# Patient Record
Sex: Male | Born: 2000 | Race: Black or African American | Hispanic: No | Marital: Single | Smoking: Never smoker
Health system: Southern US, Community
[De-identification: ages and names within clinical notes are randomized; demographics above are authoritative.]

## PROBLEM LIST (undated history)

## (undated) HISTORY — PX: WISDOM TOOTH EXTRACTION: SHX21

---
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
## 2020-10-19 ENCOUNTER — Ambulatory Visit (HOSPITAL_COMMUNITY)
Admission: EM | Admit: 2020-10-19 | Discharge: 2020-10-19 | Disposition: A | Discharge status: Home / Self Care | Payer: Self-pay | Attending: Emergency Medicine | Admitting: Emergency Medicine

## 2020-10-19 ENCOUNTER — Encounter (HOSPITAL_COMMUNITY): Payer: Self-pay

## 2020-10-19 DIAGNOSIS — Z113 Encounter for screening for infections with a predominantly sexual mode of transmission: Secondary | ICD-10-CM | POA: Insufficient documentation

## 2020-10-19 NOTE — Discharge Instructions (Addendum)
  Your STD tests are pending.  If your test results are positive, we will call you.  You and your sexual partner(s) may require treatment at that time.  Do not have sexual activity until the test results are back.  

## 2020-10-19 NOTE — ED Triage Notes (Signed)
Pt presents for STD's testing. Denies dysuria, abdominal pain, penile discharge.

## 2020-10-19 NOTE — ED Provider Notes (Signed)
[facility]    CSN: [id number] Arrival date & time: 10/19/20  1745      History   Chief Complaint Chief Complaint  Patient presents with  . STD testing    HPI Leon Russell is a 19 y.o. male.   Patient presents with request for STD testing.  He is sexually active and does not consistently use condoms.  He denies symptoms, including penile discharge, dysuria, abdominal pain, back pain, testicular pain, rash, lesions.  He denies pertinent medical history.  The history is provided by the patient.    History reviewed. No pertinent past medical history.  There are no problems to display for this patient.   History reviewed. No pertinent surgical history.     Home Medications    Prior to Admission medications   Not on File    Family History History reviewed. No pertinent family history.  Social History Social History   Tobacco Use  . Smoking status: Never Smoker  . Smokeless tobacco: Never Used  Substance Use Topics  . Alcohol use: Yes  . Drug use: Yes    Frequency: 7.0 times per week    Types: Marijuana     Allergies   Patient has no known allergies.   Review of Systems Review of Systems  Constitutional: Negative for chills and fever.  HENT: Negative for ear pain and sore throat.   Eyes: Negative for pain and visual disturbance.  Respiratory: Negative for cough and shortness of breath.   Cardiovascular: Negative for chest pain and palpitations.  Gastrointestinal: Negative for abdominal pain and vomiting.  Genitourinary: Negative for discharge, dysuria, flank pain, hematuria and testicular pain.  Musculoskeletal: Negative for arthralgias and back pain.  Skin: Negative for color change and rash.  Neurological: Negative for seizures and syncope.  All other systems reviewed and are negative.    Physical Exam Triage Vital Signs ED Triage Vitals  Enc Vitals Group     BP 10/19/20 1823 134/74     Pulse Rate 10/19/20 1823 69     Resp  10/19/20 1823 15     Temp 10/19/20 1823 98.1 F (36.7 C)     Temp Source 10/19/20 1823 Oral     SpO2 10/19/20 1823 100 %     Weight --      Height --      Head Circumference --      Peak Flow --      Pain Score 10/19/20 1824 0     Pain Loc --      Pain Edu? --      Excl. in GC? --    No data found.  Updated Vital Signs BP 134/74 (BP Location: Right Arm)   Pulse 69   Temp 98.1 F (36.7 C) (Oral)   Resp 15   SpO2 100%   Visual Acuity Right Eye Distance:   Left Eye Distance:   Bilateral Distance:    Right Eye Near:   Left Eye Near:    Bilateral Near:     Physical Exam Vitals and nursing note reviewed.  Constitutional:      General: He is not in acute distress.    Appearance: He is well-developed. He is not ill-appearing.  HENT:     Head: Normocephalic and atraumatic.     Mouth/Throat:     Mouth: Mucous membranes are moist.  Eyes:     Conjunctiva/sclera: Conjunctivae normal.  Cardiovascular:     Rate and Rhythm: Normal rate and regular rhythm.  Heart sounds: Normal heart sounds.  Pulmonary:     Effort: Pulmonary effort is normal. No respiratory distress.     Breath sounds: Normal breath sounds.  Abdominal:     Palpations: Abdomen is soft.     Tenderness: There is no abdominal tenderness. There is no right CVA tenderness, left CVA tenderness, guarding or rebound.  Musculoskeletal:     Cervical back: Neck supple.  Skin:    General: Skin is warm and dry.     Findings: No rash.  Neurological:     General: No focal deficit present.     Mental Status: He is alert and oriented to person, place, and time.     Gait: Gait normal.  Psychiatric:        Mood and Affect: Mood normal.        Behavior: Behavior normal.      UC Treatments / Results  Labs (all labs ordered are listed, but only abnormal results are displayed) Labs Reviewed  CYTOLOGY, (ORAL, ANAL, URETHRAL) ANCILLARY ONLY    EKG   Radiology No results found.  Procedures Procedures  (including critical care time)  Medications Ordered in UC Medications - No data to display  Initial Impression / Assessment and Plan / UC Course  I have reviewed the triage vital signs and the nursing notes.  Pertinent labs & imaging results that were available during my care of the patient were reviewed by me and considered in my medical decision making (see chart for details).   Screening for STDs.  Patient obtained urethral self swab for testing.  Instructed him to abstain from sexual activity until the test results are back.  Discussed that he and his sexual partners may require treatment at that time.  Patient agrees to plan of care.   Final Clinical Impressions(s) / UC Diagnoses   Final diagnoses:  Screen for STD (sexually transmitted disease)     Discharge Instructions      Your STD tests are pending.  If your test results are positive, we will call you.  You and your sexual partner(s) may require treatment at that time.  Do not have sexual activity until the test results are back.     ED Prescriptions    None     PDMP not reviewed this encounter.   Mickie Bail, NP 10/19/20 [phone]

## 2020-10-21 LAB — CYTOLOGY, (ORAL, ANAL, URETHRAL) ANCILLARY ONLY
Chlamydia: POSITIVE — AB
Comment: NEGATIVE
Comment: NEGATIVE
Comment: NORMAL
Neisseria Gonorrhea: NEGATIVE
Trichomonas: NEGATIVE

## 2020-10-22 ENCOUNTER — Telehealth (HOSPITAL_COMMUNITY): Payer: Self-pay | Admitting: Emergency Medicine

## 2020-10-22 MED ORDER — DOXYCYCLINE HYCLATE 100 MG PO CAPS: 100.0000 mg | ORAL_CAPSULE | Freq: Two times a day (BID) | ORAL | 0 refills | Status: AC

## 2022-07-16 ENCOUNTER — Ambulatory Visit (HOSPITAL_COMMUNITY): Payer: Self-pay

## 2022-08-09 ENCOUNTER — Emergency Department (HOSPITAL_COMMUNITY)
Admission: EM | Admit: 2022-08-09 | Discharge: 2022-08-09 | Discharge status: Against Medical Advice | Payer: BC Managed Care – PPO | Attending: Emergency Medicine | Admitting: Emergency Medicine

## 2022-08-09 ENCOUNTER — Encounter (HOSPITAL_COMMUNITY): Payer: Self-pay

## 2022-08-09 ENCOUNTER — Other Ambulatory Visit: Payer: Self-pay

## 2022-08-09 DIAGNOSIS — R799 Abnormal finding of blood chemistry, unspecified: Secondary | ICD-10-CM | POA: Insufficient documentation

## 2022-08-09 DIAGNOSIS — X58XXXA Exposure to other specified factors, initial encounter: Secondary | ICD-10-CM | POA: Diagnosis not present

## 2022-08-09 DIAGNOSIS — T40604A Poisoning by unspecified narcotics, undetermined, initial encounter: Secondary | ICD-10-CM

## 2022-08-09 DIAGNOSIS — Y902 Blood alcohol level of 40-59 mg/100 ml: Secondary | ICD-10-CM | POA: Diagnosis not present

## 2022-08-09 DIAGNOSIS — T402X4A Poisoning by other opioids, undetermined, initial encounter: Secondary | ICD-10-CM | POA: Diagnosis not present

## 2022-08-09 DIAGNOSIS — T50904A Poisoning by unspecified drugs, medicaments and biological substances, undetermined, initial encounter: Secondary | ICD-10-CM | POA: Diagnosis present

## 2022-08-09 LAB — CBC WITH DIFFERENTIAL/PLATELET
Abs Immature Granulocytes: 0.02 10*3/uL (ref 0.00–0.07)
Basophils Absolute: 0 10*3/uL (ref 0.0–0.1)
Basophils Relative: 0 %
Eosinophils Absolute: 0.1 10*3/uL (ref 0.0–0.5)
Eosinophils Relative: 1 %
HCT: 42.2 % (ref 39.0–52.0)
Hemoglobin: 13.8 g/dL (ref 13.0–17.0)
Immature Granulocytes: 0 %
Lymphocytes Relative: 15 %
Lymphs Abs: 1.4 10*3/uL (ref 0.7–4.0)
MCH: 31 pg (ref 26.0–34.0)
MCHC: 32.7 g/dL (ref 30.0–36.0)
MCV: 94.8 fL (ref 80.0–100.0)
Monocytes Absolute: 0.7 10*3/uL (ref 0.1–1.0)
Monocytes Relative: 7 %
Neutro Abs: 7.2 10*3/uL (ref 1.7–7.7)
Neutrophils Relative %: 77 %
Platelets: 317 10*3/uL (ref 150–400)
RBC: 4.45 MIL/uL (ref 4.22–5.81)
RDW: 12.6 % (ref 11.5–15.5)
WBC: 9.4 10*3/uL (ref 4.0–10.5)
nRBC: 0 % (ref 0.0–0.2)

## 2022-08-09 LAB — ACETAMINOPHEN LEVEL: Acetaminophen (Tylenol), Serum: <10 ug/mL — ABNORMAL LOW (ref 10–30)

## 2022-08-09 LAB — COMPREHENSIVE METABOLIC PANEL
ALT: 29 U/L (ref 0–44)
AST: 47 U/L — ABNORMAL HIGH (ref 15–41)
Albumin: 4.5 g/dL (ref 3.5–5.0)
Alkaline Phosphatase: 40 U/L (ref 38–126)
Anion gap: 22 — ABNORMAL HIGH (ref 5–15)
BUN: 10 mg/dL (ref 6–20)
CO2: 25 mmol/L (ref 22–32)
Calcium: 9.5 mg/dL (ref 8.9–10.3)
Chloride: 94 mmol/L — ABNORMAL LOW (ref 98–111)
Creatinine, Ser: 1.17 mg/dL (ref 0.61–1.24)
GFR, Estimated: >60 mL/min (ref >60)
Glucose, Bld: 208 mg/dL — ABNORMAL HIGH (ref 70–99)
Potassium: 4.9 mmol/L (ref 3.5–5.1)
Sodium: 141 mmol/L (ref 135–145)
Total Bilirubin: 0.5 mg/dL (ref 0.3–1.2)
Total Protein: 8.3 g/dL — ABNORMAL HIGH (ref 6.5–8.1)

## 2022-08-09 LAB — ETHANOL: Alcohol, Ethyl (B): 56 mg/dL — ABNORMAL HIGH (ref <10)

## 2022-08-09 LAB — SALICYLATE LEVEL: Salicylate Lvl: <7 mg/dL — ABNORMAL LOW (ref 7.0–30.0)

## 2022-08-09 MED ORDER — NALOXONE HCL 0.4 MG/ML IJ SOLN
INTRAMUSCULAR | Status: AC
  Filled 2022-08-09: qty 1

## 2022-08-09 MED ORDER — NALOXONE HCL 0.4 MG/ML IJ SOLN
0.4000 mg | Freq: Once | INTRAMUSCULAR | Status: AC
  Administered 2022-08-09: 0.4 mg via INTRAVENOUS
  Filled 2022-08-09: qty 1

## 2022-08-09 MED ORDER — ONDANSETRON HCL 4 MG/2ML IJ SOLN: 4.0000 mg | Freq: Once | INTRAMUSCULAR | Status: AC

## 2022-08-09 MED ORDER — ONDANSETRON HCL 4 MG/2ML IJ SOLN
INTRAMUSCULAR | Status: AC
  Administered 2022-08-09: 4 mg via INTRAVENOUS
  Filled 2022-08-09: qty 2

## 2022-08-09 NOTE — ED Triage Notes (Signed)
OD on xanax, percocet. 4mg  narcan given. Taken nasally

## 2022-08-09 NOTE — ED Provider Notes (Signed)
[facility] EMERGENCY DEPARTMENT Provider Note   CSN: [id number] Arrival date & time: 08/09/22  1413     History   TERIQUE SKENE is a 21 y.o. male with no significant PMH presents with OD.   EMS called out for an OD. Patient reportedly snorted 2 percocet and 2 bars of xanax.  Patient was apneic on EMS arrival, EMS bagged patient and gave total of 4 mg intranasal Narcan, to which patient woke up and began breathing on his own. Further history limited by patient's AMS. EMS POC glucose 200s.   HPI     Home Medications Prior to Admission medications   Not on File      Allergies    Patient has no known allergies.    Review of Systems   Review of Systems  Unable to perform ROS: Mental status change    Physical Exam Updated Vital Signs BP 132/73   Pulse 75   Temp 98.1 F (36.7 C) (Axillary)   Resp 13   SpO2 100%  Physical Exam General: Intoxicated-appearing male, lying in bed.  HEENT: PERRLA 48mm, NCAT, No midline C-spine tenderness/ stepoffs, Sclera anicteric, MMM, trachea midline. Cardiology: RRR, no murmurs/rubs/gallops. BL radial and DP pulses equal bilaterally.  Resp: Normal respiratory rate and effort. CTAB, no wheezes, rhonchi, crackles.  Abd: Soft, non-tender, non-distended. No rebound tenderness or guarding.  GU: Deferred. MSK: No peripheral edema or signs of trauma. Extremities without deformity or TTP. No cyanosis or clubbing. Skin: warm, dry. No rashes or lesions. Neuro: Following commands, GCS 9 (E3V1M5), CNs II-XII grossly intact. MAEs. Sensation grossly intact.  Psych: [place]  ED Results / Procedures / Treatments   Labs (all labs ordered are listed, but only abnormal results are displayed) Labs Reviewed  COMPREHENSIVE METABOLIC PANEL - Abnormal; Notable for the following components:      Result Value   Chloride 94 (*)    Glucose, Bld 208 (*)    Total Protein 8.3 (*)    AST 47 (*)    Anion gap 22 (*)    All other components within  normal limits  SALICYLATE LEVEL - Abnormal; Notable for the following components:   Salicylate Lvl [id number] (*)    All other components within normal limits  ACETAMINOPHEN LEVEL - Abnormal; Notable for the following components:   Acetaminophen (Tylenol), Serum <10 (*)    All other components within normal limits  ETHANOL - Abnormal; Notable for the following components:   Alcohol, Ethyl (B) 56 (*)    All other components within normal limits  CBC WITH DIFFERENTIAL/PLATELET  RAPID URINE DRUG SCREEN, HOSP PERFORMED  URINALYSIS, ROUTINE W REFLEX MICROSCOPIC  CBG MONITORING, ED    EKG EKG Interpretation  Date/Time:  Sunday August 09 2022 14:27:30 EDT Ventricular Rate:  103 PR Interval:  172 QRS Duration: 98 QT Interval:  335 QTC Calculation: 439 R Axis:   103 Text Interpretation: Sinus tachycardia Biatrial enlargement Left ventricular hypertrophy Artifact in lead(s) I II III aVR aVL V1 V2 V4 V6 Confirmed by Cindee Lame [phone]) on 08/09/2022 9:19:48 PM  Radiology No results found.  Procedures Procedures    Medications Ordered in ED Medications  ondansetron (ZOFRAN) injection 4 mg (4 mg Intravenous Given 08/09/22 1422)  naloxone [facility]) injection 0.4 mg (0.4 mg Intravenous Given 08/09/22 1509)    ED Course/ Medical Decision Making/ A&P  Medical Decision Making Amount and/or Complexity of Data Reviewed Labs: ordered. Decision-making details documented in ED Course.  Risk Prescription drug management.   Clinical history significant for opiate OD. Patient now breathing on his own with stable vitals.  Pupils equal reactive 5 mm, no focal neurodeficits apparent on exam, no evidence of head trauma to indicate [place].  Will test for concomitant toxic ingestion such as EtOH.  Patient no longer hypoxic on SPO2, POC glucose by EMS was in the 200s and will recheck here for any evidence of hypoglycemia.  EKG w/ sinus tachycardia, significant baseline artifact,  but patient denies CP and will repeat EKG.   I have personally reviewed and interpreted all labs and imaging, which are listed below.  Clinical Course as of 08/09/22 2119  Nancy Fetter Aug 09, 2022  1556 Alcohol, Ethyl (B)(!): 56 [HN]  1556 CBC WITH DIFFERENTIAL wnl [HN]  1556 Comprehensive metabolic panel(!) Slightly elevated glucose and AST, otherwise unremarkable. [HN]  1941 Patient reevaluated. Very drowsy, now responsive to voice and can repeat his name but does not know where he is, cannot give any history. Will CTM. [HN]  [id number] Patient no longer in room [HN]    Clinical Course User Index [HN] Audley Hose, MD   Patient got up and walked out of room without notifying this MD or bedside RN. Patient left prior to workup being complete. Was observing patient for return to his mental status baseline. Was unable to give discharge instructions or return precautions.   Dispo: left prior to workup being complete         Final Clinical Impression(s) / ED Diagnoses Final diagnoses:  Opiate overdose, undetermined intent, initial encounter [facility])    Rx / DC Orders ED Discharge Orders     None        This note was created using dictation software, which may contain spelling or grammatical errors.    Audley Hose, MD 08/09/22 2121

## 2022-08-14 ENCOUNTER — Other Ambulatory Visit (HOSPITAL_COMMUNITY)
Admission: EM | Admit: 2022-08-14 | Discharge: 2022-08-16 | Disposition: A | Discharge status: Home / Self Care | Payer: No Payment, Other | Attending: Psychiatry | Admitting: Psychiatry

## 2022-08-14 DIAGNOSIS — Z1152 Encounter for screening for COVID-19: Secondary | ICD-10-CM | POA: Insufficient documentation

## 2022-08-14 DIAGNOSIS — F1319 Sedative, hypnotic or anxiolytic abuse with unspecified sedative, hypnotic or anxiolytic-induced disorder: Secondary | ICD-10-CM | POA: Insufficient documentation

## 2022-08-14 DIAGNOSIS — Z79899 Other long term (current) drug therapy: Secondary | ICD-10-CM | POA: Diagnosis not present

## 2022-08-14 DIAGNOSIS — F32A Depression, unspecified: Secondary | ICD-10-CM | POA: Diagnosis not present

## 2022-08-14 DIAGNOSIS — Z9151 Personal history of suicidal behavior: Secondary | ICD-10-CM | POA: Insufficient documentation

## 2022-08-14 DIAGNOSIS — Z114 Encounter for screening for human immunodeficiency virus [HIV]: Secondary | ICD-10-CM | POA: Insufficient documentation

## 2022-08-14 DIAGNOSIS — F1994 Other psychoactive substance use, unspecified with psychoactive substance-induced mood disorder: Secondary | ICD-10-CM | POA: Diagnosis present

## 2022-08-14 DIAGNOSIS — F1310 Sedative, hypnotic or anxiolytic abuse, uncomplicated: Secondary | ICD-10-CM

## 2022-08-14 LAB — CBC WITH DIFFERENTIAL/PLATELET
Abs Immature Granulocytes: 0.01 10*3/uL (ref 0.00–0.07)
Basophils Absolute: 0.1 10*3/uL (ref 0.0–0.1)
Basophils Relative: 1 %
Eosinophils Absolute: 0.3 10*3/uL (ref 0.0–0.5)
Eosinophils Relative: 5 %
HCT: 43.3 % (ref 39.0–52.0)
Hemoglobin: 14.6 g/dL (ref 13.0–17.0)
Immature Granulocytes: 0 %
Lymphocytes Relative: 42 %
Lymphs Abs: 2.1 10*3/uL (ref 0.7–4.0)
MCH: 31.3 pg (ref 26.0–34.0)
MCHC: 33.7 g/dL (ref 30.0–36.0)
MCV: 92.9 fL (ref 80.0–100.0)
Monocytes Absolute: 0.3 10*3/uL (ref 0.1–1.0)
Monocytes Relative: 7 %
Neutro Abs: 2.3 10*3/uL (ref 1.7–7.7)
Neutrophils Relative %: 45 %
Platelets: 341 10*3/uL (ref 150–400)
RBC: 4.66 MIL/uL (ref 4.22–5.81)
RDW: 12.3 % (ref 11.5–15.5)
WBC: 5 10*3/uL (ref 4.0–10.5)
nRBC: 0 % (ref 0.0–0.2)

## 2022-08-14 LAB — POCT URINE DRUG SCREEN - MANUAL ENTRY (I-SCREEN)
POC Amphetamine UR: NOT DETECTED
POC Buprenorphine (BUP): NOT DETECTED
POC Cocaine UR: POSITIVE — AB
POC Marijuana UR: POSITIVE — AB
POC Methadone UR: NOT DETECTED
POC Methamphetamine UR: NOT DETECTED
POC Morphine: NOT DETECTED
POC Oxazepam (BZO): POSITIVE — AB
POC Oxycodone UR: NOT DETECTED
POC Secobarbital (BAR): NOT DETECTED

## 2022-08-14 LAB — COMPREHENSIVE METABOLIC PANEL
ALT: 21 U/L (ref 0–44)
AST: 24 U/L (ref 15–41)
Albumin: 4.1 g/dL (ref 3.5–5.0)
Alkaline Phosphatase: 69 U/L (ref 38–126)
Anion gap: 7 (ref 5–15)
BUN: 13 mg/dL (ref 6–20)
CO2: 29 mmol/L (ref 22–32)
Calcium: 9.5 mg/dL (ref 8.9–10.3)
Chloride: 106 mmol/L (ref 98–111)
Creatinine, Ser: 1.14 mg/dL (ref 0.61–1.24)
GFR, Estimated: >60 mL/min (ref >60)
Glucose, Bld: 80 mg/dL (ref 70–99)
Potassium: 3.4 mmol/L — ABNORMAL LOW (ref 3.5–5.1)
Sodium: 142 mmol/L (ref 135–145)
Total Bilirubin: 0.4 mg/dL (ref 0.3–1.2)
Total Protein: 7.9 g/dL (ref 6.5–8.1)

## 2022-08-14 LAB — ETHANOL: Alcohol, Ethyl (B): <10 mg/dL (ref <10)

## 2022-08-14 LAB — HEMOGLOBIN A1C
Hgb A1c MFr Bld: 5.3 % (ref 4.8–5.6)
Mean Plasma Glucose: 105.41 mg/dL

## 2022-08-14 LAB — LIPID PANEL
Cholesterol: 153 mg/dL (ref 0–200)
HDL: 52 mg/dL (ref >40)
LDL Cholesterol: 67 mg/dL (ref 0–99)
Total CHOL/HDL Ratio: 2.9 RATIO
Triglycerides: 171 mg/dL — ABNORMAL HIGH (ref <150)
VLDL: 34 mg/dL (ref 0–40)

## 2022-08-14 LAB — POC SARS CORONAVIRUS 2 AG: SARSCOV2ONAVIRUS 2 AG: NEGATIVE

## 2022-08-14 LAB — RESP PANEL BY RT-PCR (FLU A&B, COVID) ARPGX2
Influenza A by PCR: NEGATIVE
Influenza B by PCR: NEGATIVE
SARS Coronavirus 2 by RT PCR: NEGATIVE

## 2022-08-14 LAB — HIV ANTIBODY (ROUTINE TESTING W REFLEX): HIV Screen 4th Generation wRfx: NONREACTIVE

## 2022-08-14 LAB — TSH: TSH: 2.296 u[IU]/mL (ref 0.350–4.500)

## 2022-08-14 MED ORDER — TRAZODONE HCL 50 MG PO TABS
50.0000 mg | ORAL_TABLET | Freq: Every evening | ORAL | Status: DC | PRN
  Administered 2022-08-15: 50 mg via ORAL
  Filled 2022-08-14 (×2): qty 1

## 2022-08-14 MED ORDER — LORAZEPAM 1 MG PO TABS: 1.0000 mg | ORAL_TABLET | Freq: Four times a day (QID) | ORAL | Status: DC | PRN

## 2022-08-14 MED ORDER — ONDANSETRON 4 MG PO TBDP: 4.0000 mg | ORAL_TABLET | Freq: Four times a day (QID) | ORAL | Status: DC | PRN

## 2022-08-14 MED ORDER — ADULT MULTIVITAMIN W/MINERALS CH
1.0000 | ORAL_TABLET | Freq: Every day | ORAL | Status: DC
  Administered 2022-08-14 – 2022-08-16 (×3): 1 via ORAL
  Filled 2022-08-14 (×3): qty 1

## 2022-08-14 MED ORDER — ACETAMINOPHEN 325 MG PO TABS: 650.0000 mg | ORAL_TABLET | Freq: Four times a day (QID) | ORAL | Status: DC | PRN

## 2022-08-14 MED ORDER — ALUM & MAG HYDROXIDE-SIMETH 200-200-20 MG/5ML PO SUSP: 30.0000 mL | ORAL | Status: DC | PRN

## 2022-08-14 MED ORDER — THIAMINE HCL 100 MG/ML IJ SOLN
100.0000 mg | Freq: Once | INTRAMUSCULAR | Status: AC
  Administered 2022-08-14: 100 mg via INTRAMUSCULAR
  Filled 2022-08-14: qty 2

## 2022-08-14 MED ORDER — ENSURE ENLIVE PO LIQD
237.0000 mL | Freq: Two times a day (BID) | ORAL | Status: DC
  Administered 2022-08-15 – 2022-08-16 (×2): 237 mL via ORAL
  Filled 2022-08-14 (×2): qty 237

## 2022-08-14 MED ORDER — LOPERAMIDE HCL 2 MG PO CAPS: 2.0000 mg | ORAL_CAPSULE | ORAL | Status: DC | PRN

## 2022-08-14 MED ORDER — HYDROXYZINE HCL 25 MG PO TABS
25.0000 mg | ORAL_TABLET | Freq: Three times a day (TID) | ORAL | Status: DC | PRN
  Filled 2022-08-14: qty 1

## 2022-08-14 MED ORDER — MAGNESIUM HYDROXIDE 400 MG/5ML PO SUSP: 30.0000 mL | Freq: Every day | ORAL | Status: DC | PRN

## 2022-08-14 MED ORDER — THIAMINE MONONITRATE 100 MG PO TABS
100.0000 mg | ORAL_TABLET | Freq: Every day | ORAL | Status: DC
  Administered 2022-08-15: 100 mg via ORAL
  Filled 2022-08-14: qty 1

## 2022-08-14 NOTE — ED Triage Notes (Signed)
Pt presents to [facility] accompanied by his mother. Pt states he is seeking substance use treatment. Pt reports suicide attempt Sunday 9/24 by intentional overdose on cocaine and Xanax. Pt reports 4 prior suicide attempts. Pt denies SI at this time.Pt denies HI and AVH at this time.

## 2022-08-14 NOTE — ED Notes (Signed)
Patient arrived on unit. Patient calm and cooperative. Patient safe on unit with continued monitoring. 

## 2022-08-14 NOTE — ED Notes (Signed)
Pt sleeping@this time. Breathing even and unlabored. Will continue to monitor for safety 

## 2022-08-14 NOTE — BH Assessment (Signed)
Comprehensive Clinical Assessment (CCA) Note  08/14/2022 Leon Russell [id number]  Disposition: Per Darrol Angel, NP, patient is recommended for inpatient treatment.  [place] from 08/14/2022 in [place] [facility]      The patient demonstrates the following risk factors for suicide: Chronic risk factors for suicide include: substance use disorder and previous suicide attempts x4 . Acute risk factors for suicide include: family or marital conflict. Protective factors for this patient include: positive social support. Considering these factors, the overall suicide risk at this point appears to be high. Patient is not appropriate for outpatient follow up.  Chief Complaint:  Chief Complaint  Patient presents with   Addiction Problem   Depression   Visit Diagnosis: Polysubstance use    CCA Screening, Triage and Referral (STR)  Patient Reported Information How did you hear about [place]? Family/Friend  What Is the Reason for Your Visit/Call Today? Pt presents to [facility] accompanied by his mother. Pt states he is seeking substance use treatment. Pt reports suicide attempt Sunday 9/24 by intentional overdose on cocaine and Xanax. Pt reports 4 prior suicide attempts. Pt denies SI at this time.Pt denies HI and AVH at this time.  Leon Russell is a 21 year old male presenting to [facility] voluntarily with chief complaint of substance use and recent suicide attempt on Sunday. Patient reports stressors related to interpersonal relationship with his father and girlfriend who he broke up with on Monday. Patient reports that he has been feeling depressed and noticed symptoms of depression as early as 21 years old. Patient did not want to give details about events that happened when he was 21 years old but acknowledges something occurred causing him to feel depressed. Patient reports polysubstance use since the age of 62 when he started using cocaine.  Patient also reports using molly, meth, acid, Xanax and alcohol. Patient denies any outpatient treatment, nor has he received any treatment for his substance use.   Patient lives at home with his parents and younger brother. 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 Patient does not have access to a firearm, and he denies current legal issues.  Patient is oriented x4, engaged, alert and cooperative during assessment. Patient eye contact and speech is normal, his affect is appropriate, and thoughts are linear. Patient denies SI, HI, AVH and SIB. Patient has attempted suicide 4x and reports two attempts was this year.    How Long Has This Been Causing You Problems? 1 wk - 1 month  What Do You Feel Would Help You the Most Today? Alcohol or Drug Use Treatment; Treatment for Depression or other mood problem   Have You Recently Had Any Thoughts About Hurting Yourself? Yes  Are You Planning to Commit Suicide/Harm Yourself At This time? No   Have you Recently Had Thoughts About [place]? No  Are You Planning to Harm Someone at This Time? No  Explanation: No data recorded  Have You Used Any Alcohol or Drugs in the Past 24 Hours? No  How Long Ago Did You Use Drugs or Alcohol? No data recorded What Did You Use and How Much? No data recorded  Do You Currently Have a Therapist/Psychiatrist? No  Name of Therapist/Psychiatrist: No data recorded  Have You Been Recently Discharged From Any Office Practice or Programs? No  Explanation of Discharge From Practice/Program: No data recorded    CCA Screening Triage  Referral Assessment Type of Contact: Face-to-Face  Telemedicine Service Delivery:   Is this Initial or Reassessment? No data recorded Date Telepsych consult ordered in [facility]:  No data recorded Time Telepsych consult ordered in [facility]:  No data recorded Location of Assessment: [facility] Assessment Services  Provider  Location: [facility] Assessment Services   Collateral Involvement: mom   Does Patient Have a Automotive engineer Guardian? No  Legal Guardian Contact Information: No data recorded Copy of Legal Guardianship Form: No data recorded Legal Guardian Notified of Arrival: No data recorded Legal Guardian Notified of Pending Discharge: No data recorded If Minor and Not Living with Parent(s), Who has Custody? No data recorded Is CPS involved or ever been involved? Never  Is APS involved or ever been involved? Never   Patient Determined To Be At Risk for Harm To Self or Others Based on Review of Patient Reported Information or Presenting Complaint? Yes, for Self-Harm  Method: No data recorded Availability of Means: No data recorded Intent: No data recorded Notification Required: No data recorded Additional Information for Danger to Others Potential: No data recorded Additional Comments for Danger to Others Potential: No data recorded Are There Guns or Other Weapons in Your Home? No data recorded Types of Guns/Weapons: No data recorded Are These Weapons Safely Secured?                            No data recorded Who Could Verify You Are Able To Have These Secured: No data recorded Do You Have any Outstanding Charges, Pending Court Dates, Parole/Probation? No data recorded Contacted To Inform of Risk of Harm To Self or Others: Family/Significant Other:    Does Patient Present under Involuntary Commitment? No  IVC Papers Initial File Date: No data recorded  [place] of Residence: [place]   Patient Currently Receiving the Following Services: Not Receiving Services   Determination of Need: Urgent (48 hours)   Options For Referral: Facility-Based Crisis; Inpatient Hospitalization     CCA Biopsychosocial Patient Reported Schizophrenia/Schizoaffective Diagnosis in Past: No   Strengths: motivated for treatment   Mental Health Symptoms Depression:   Change in energy/activity;  Sleep (too much or little); Tearfulness; Difficulty Concentrating; Irritability   Duration of Depressive symptoms:  Duration of Depressive Symptoms: Greater than two weeks   Mania:   None   Anxiety:    Worrying; Tension   Psychosis:   None   Duration of Psychotic symptoms:    Trauma:   None   Obsessions:   None   Compulsions:   None   Inattention:   None   Hyperactivity/Impulsivity:   None   Oppositional/Defiant Behaviors:   None   Emotional Irregularity:   None   Other Mood/Personality Symptoms:  No data recorded   Mental Status Exam Appearance and self-care  Stature:   Average   Weight:   Average weight   Clothing:   Age-appropriate; Neat/clean   Grooming:   Normal   Cosmetic use:   None   Posture/gait:   Normal   Motor activity:   Not Remarkable   Sensorium  Attention:   Normal   Concentration:   Normal   Orientation:   X5   Recall/memory:   Normal   Affect and Mood  Affect:   Full Range   Mood:   Euthymic   Relating  Eye contact:   Normal   Facial expression:   Responsive   Attitude toward examiner:  Cooperative   Thought and Language  Speech flow:  Clear and Coherent   Thought content:   Appropriate to Mood and Circumstances   Preoccupation:   None   Hallucinations:   None   Organization:  No data recorded  [employer] of Knowledge:   Good   Intelligence:   Average   Abstraction:   Normal   Judgement:   Poor   Reality Testing:   Adequate   Insight:   Good   Decision Making:   Impulsive   Social Functioning  Social Maturity:   Responsible   Social Judgement:   Normal   Stress  Stressors:   Family conflict; Relationship   Coping Ability:   Overwhelmed; Exhausted   Skill Deficits:   None   Supports:   Family; Support needed; Church     Religion: Religion/Spirituality Are You A Religious Person?: Yes What is Your Religious Affiliation?: Christian How  Might This Affect Treatment?: none  Leisure/Recreation: Leisure / Recreation Leisure and Hobbies: unknown  Exercise/Diet: Exercise/Diet Do You Exercise?: No Have You Gained or Lost A Significant Amount of Weight in the Past Six Months?: No Do You Follow a Special Diet?: No Do You Have Any Trouble Sleeping?: Yes Explanation of Sleeping Difficulties: Sleeping about 4 hours a night   CCA Employment/Education Employment/Work Situation: Employment / Work Situation Employment Situation: Employed Work Stressors: none Patient's Job has Been Impacted by Current Illness: No Has Patient ever Been in Equities trader?: No  Education: Education Is Patient Currently Attending School?: No Last Grade Completed: 12 Did You Product manager?: No Did You Have An Individualized Education Program (IIEP): No Did You Have Any Difficulty At [employer]?: No Patient's Education Has Been Impacted by Current Illness: No   CCA Family/Childhood History Family and Relationship History: Family history Marital status: Single Does patient have children?: No  Childhood History:  Childhood History By whom was/is the patient raised?: Both parents Did patient suffer any verbal/emotional/physical/sexual abuse as a child?: No Did patient suffer from severe childhood neglect?: No Has patient ever been sexually abused/assaulted/raped as an adolescent or adult?: No Was the patient ever a victim of a crime or a disaster?: No Witnessed domestic violence?:  (unknown) Has patient been affected by domestic violence as an adult?:  (unknown)  Child/Adolescent Assessment:     CCA Substance Use Alcohol/Drug Use: Alcohol / Drug Use Pain Medications: See MAR Prescriptions: See MAR Over the Counter: See MAR History of alcohol / drug use?: Yes Negative Consequences of Use: Legal, Personal relationships, Work / School Substance #1 Name of Substance 1: Cocaine 1 - Age of First Use: 21 yo 1 - Amount (size/oz): "3  balls" 1 - Frequency: daily 1 - Duration: ongoing 1 - Last Use / Amount: Sunday Substance #2 Name of Substance 2: Xanax 2 - Age of First Use: 14 2 - Amount (size/oz): 3-7 bars 2 - Frequency: once a week 2 - Last Use / Amount: Sunday Substance #3 Name of Substance 3: "Molly" 3 - Age of First Use: 20-One month ago 3 - Amount (size/oz): "zip" 3 - Frequency: every two weeks 3 - Duration: ongoing 3 - Last Use / Amount: month ago Substance #4 Name of Substance 4: Alcohol 4 - Age of First Use: 21yo 4 - Amount (size/oz): Unknonwn 4 - Frequency: two timce a week 4 - Last Use / Amount: last week Substance #5 Name of Substance 5: Methamphetamines 5 - Age of First Use: 20-Four weeks ago  ASAM's:  Six Dimensions of Multidimensional Assessment  Dimension 1:  Acute Intoxication and/or Withdrawal Potential:   Dimension 1:  Description of individual's past and current experiences of substance use and withdrawal: Polysubstance use. Hx of overdosing  Dimension 2:  Biomedical Conditions and Complications:   Dimension 2:  Description of patient's biomedical conditions and  complications: Dental issues, no other medical issues mentioned  Dimension 3:  Emotional, Behavioral, or Cognitive Conditions and Complications:  Dimension 3:  Description of emotional, behavioral, or cognitive conditions and complications: Pt reports depressions since 21 yo. No outpatient/inpatient treatment or interventions for mental health.  Dimension 4:  Readiness to Change:  Dimension 4:  Description of Readiness to Change criteria: Pt reports intrinsic motiivation for change. Preparation stage of change  Dimension 5:  Relapse, Continued use, or Continued Problem Potential:  Dimension 5:  Relapse, continued use, or continued problem potential critiera description: No history of detox or rehab treatment. Pt family is now aware of substance use issues and can be a support.  Dimension 6:  Recovery/Living  Environment:  Dimension 6:  Recovery/Iiving environment criteria description: Pt living with his parents. Some interpersonal conflict with his dad.  ASAM Severity Score: ASAM's Severity Rating Score: 10  ASAM Recommended Level of Treatment: ASAM Recommended Level of Treatment: Level III Residential Treatment   Substance use Disorder (SUD) Substance Use Disorder (SUD)  Checklist Symptoms of Substance Use: Continued use despite having a persistent/recurrent physical/psychological problem caused/exacerbated by use, Continued use despite persistent or recurrent social, interpersonal problems, caused or exacerbated by use  Recommendations for Services/Supports/Treatments: Recommendations for Services/Supports/Treatments Recommendations For Services/Supports/Treatments: Individual Therapy, Inpatient Hospitalization  Discharge Disposition: Discharge Disposition Medical Exam completed: Yes Disposition of Patient: Admit Mode of transportation if patient is discharged/movement?: Car  DSM5 Diagnoses: There are no problems to display for this patient.    Referrals to Alternative Service(s): Referred to Alternative Service(s):   Place:   Date:   Time:    Referred to Alternative Service(s):   Place:   Date:   Time:    Referred to Alternative Service(s):   Place:   Date:   Time:    Referred to Alternative Service(s):   Place:   Date:   Time:     Audree Camel, [facility]

## 2022-08-14 NOTE — ED Notes (Signed)
Pt sleeping at present, no distress noted.  Monitoring for safety. 
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
## 2022-08-14 NOTE — ED Notes (Cosign Needed Addendum)
Patient was assessed and brought onto unit. Patient cooperated throughout admission/assessment process. Patient denied any current plan/intent to harm himself at this time and states that his last thoughts of harming himself were prior to admission. He did explain that he was here due to others stating they "Felt like he was going to OD again." He seems evasive and is not consistent with his statements regarding his state of mind prior to admission. He verbalizes that he does believe he is where he needs to be currently for his own safety/care. Denied any Auditory or visual hallucinations and depression at this time.

## 2022-08-15 DIAGNOSIS — Z79899 Other long term (current) drug therapy: Secondary | ICD-10-CM | POA: Diagnosis not present

## 2022-08-15 DIAGNOSIS — F1994 Other psychoactive substance use, unspecified with psychoactive substance-induced mood disorder: Secondary | ICD-10-CM | POA: Diagnosis present

## 2022-08-15 DIAGNOSIS — Z9151 Personal history of suicidal behavior: Secondary | ICD-10-CM | POA: Diagnosis not present

## 2022-08-15 DIAGNOSIS — F1319 Sedative, hypnotic or anxiolytic abuse with unspecified sedative, hypnotic or anxiolytic-induced disorder: Secondary | ICD-10-CM | POA: Diagnosis not present

## 2022-08-15 DIAGNOSIS — F32A Depression, unspecified: Secondary | ICD-10-CM | POA: Diagnosis not present

## 2022-08-15 LAB — RPR: RPR Ser Ql: NONREACTIVE

## 2022-08-15 MED ORDER — METHOCARBAMOL 500 MG PO TABS: 500.0000 mg | ORAL_TABLET | Freq: Three times a day (TID) | ORAL | Status: DC | PRN

## 2022-08-15 MED ORDER — LORAZEPAM 2 MG/ML IJ SOLN: 1.0000 mg | Freq: Four times a day (QID) | INTRAMUSCULAR | Status: DC | PRN

## 2022-08-15 MED ORDER — LORAZEPAM 1 MG PO TABS: 1.0000 mg | ORAL_TABLET | Freq: Four times a day (QID) | ORAL | Status: DC | PRN

## 2022-08-15 MED ORDER — NAPROXEN 500 MG PO TABS: 500.0000 mg | ORAL_TABLET | Freq: Two times a day (BID) | ORAL | Status: DC | PRN

## 2022-08-15 MED ORDER — DICYCLOMINE HCL 20 MG PO TABS: 20.0000 mg | ORAL_TABLET | Freq: Four times a day (QID) | ORAL | Status: DC | PRN

## 2022-08-15 MED ORDER — CLONIDINE HCL 0.1 MG PO TABS: 0.1000 mg | ORAL_TABLET | Freq: Three times a day (TID) | ORAL | Status: DC | PRN
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
## 2022-08-15 NOTE — ED Provider Notes (Signed)
Facility Based Crisis Admission H&P  Date: 08/15/22 Patient Name: Leon Russell MRN: [id number] Chief Complaint:  Chief Complaint  Patient presents with   Addiction Problem   Depression      Diagnoses:  Final diagnoses:  None    HPI:  Leon Russell 21 y.o., male patient presented voluntarily [facility] as a walk in 08/14/22 accompanied by mother [name] his mother and [name] for substance abuse treatment and recent suicidal attempt by overdosing. Patient was initially admitted to the continuous assessment unit, is being transferred to facility based crisis for detox.    Leon Russell, 21 y.o., male patient seen face to face by this provider, consulted with Dr. Lucianne Muss; and chart reviewed on 08/15/22.     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 He uses on average 2-3 bars of cocaine and 6-7 Xanax per day and often crushes, mixes substances and snorts them.  Patient also admits about a month ago snorting and injecting IV methamphetamines and acid.  He reports drinking is a social problem and not an addictive problem as he only does it a few times a week and can go several weeks without use of alcohol as his main source of pleasure and enjoyment is cocaine and Xanax.  Patient endorses that his last drink was approximately 2 to 3 weeks ago however he last used Xanax and cocaine on September 24 and on this date patient was seen and [name] for a drug overdose which he reports was an intentional overdose in which patient lost consciousness and request Narcan to regain baseline LOC. Patient endorses a history of 4 prior suicide attempts and longstanding history of depression without prior treatment. He states that he last used cocaine and benzodiazapine  the day  prior to admission.  No history of alcohol withdrawal seizures or DTs. Patient has never attempt detox of substances previously. Denies any prior or current treatment of any other diagnosed mental health conditions. Patient reports after taking with trusted social support, he voluntarily came in for addiction treatment. Today patient denies SI/HI/AH/VH.  During evaluation Leon Russell is in a sitting position in the chair in no acute distress. He  is alert, oriented x 4, calm, cooperative and attentive.  His mood is depressed with congruent affect.  He has normal speech, and behavior.  Objectively there is no evidence of psychosis/mania or delusional thinking.  Patient is able to converse coherently, goal directed thoughts, no distractibility, or pre-occupation.  Although he denies SI/HI, he is requesting assistance with detox. He denies any plan for SI. Patient meets criteria for facility based crisis unit . PHQ 2-9:   Flowsheet Row ED from 08/14/2022 in [facility]  C-SSRS RISK CATEGORY High Risk        Total Time spent with patient: 30 minutes  Musculoskeletal  Strength & Muscle Tone: within normal limits Gait & Station: normal Patient leans: N/A  Psychiatric Specialty Exam  Presentation General Appearance:  Appropriate for Environment  Eye Contact: Good  Speech: Clear and Coherent  Speech Volume: Normal  Handedness: Ambidextrous   Mood and Affect  Mood: Depressed  Affect: Appropriate   Thought Process  Thought Processes: Coherent  Descriptions of Associations:Intact  Orientation:Full (Time, Place and Person)  Thought Content:Logical  Diagnosis of Schizophrenia or Schizoaffective disorder in past: No   Hallucinations:Hallucinations: None Description of Auditory Hallucinations: none  Ideas of Reference:None  Suicidal Thoughts:Suicidal Thoughts: No  Homicidal Thoughts:Homicidal Thoughts: No   Sensorium   Memory: Immediate Good; Recent Good; Remote Good  Judgment: Intact  Insight: Present   Executive Functions  Concentration: Fair  Attention Span: Fair  Recall: Fair  Fund of Knowledge: Fair  Language: Good   Psychomotor Activity  Psychomotor Activity: Psychomotor Activity: Normal   Assets  Assets: Communication Skills; Resilience; Social Support; Talents/Skills; Vocational/Educational   Sleep  Sleep: Sleep: Fair   Nutritional Assessment (For OBS and FBC admissions only) Has the patient had a weight loss or gain of 10 pounds or more in the last 3 months?: No Has the patient had a decrease in food intake/or appetite?: Yes Does the patient have dental problems?: No Does the patient have eating habits or behaviors that may be indicators of an eating disorder including binging or inducing vomiting?: No Has the patient recently lost weight without trying?: 2.0 Has the patient been eating poorly because of a decreased appetite?: 1 Malnutrition Screening Tool Score: 3 Nutritional Assessment Referrals: Medication/Tx changes     Blood pressure (!) 140/55, pulse 70, temperature 98.8 F (37.1 C), temperature source Oral, resp. rate 18, SpO2 100 %. There is no height or weight on file to calculate BMI.  HENT:     Head: Normocephalic.     Nose: Nose normal.  Eyes:     Conjunctiva/sclera: Conjunctivae normal.  Cardiovascular:     Rate and Rhythm Normal Rate     Pulmonary:     Effort: Pulmonary effort is normal.  Musculoskeletal:        General: Normal range of motion.  Neurological:     Mental Status: He is alert and oriented to person, place, and time.    Review of Systems  Psychiatric/Behavioral:  Positive for depression and substance abuse. Negative for hallucinations and suicidal ideas. The patient has insomnia. The patient is not nervous/anxious.       Past Psychiatric History: No formal diagnosed mental health diagnosis.   Is the patient at risk  to self? Yes  Has the patient been a risk to self in the past 6 months? Yes .    Has the patient been a risk to self within the distant past? Yes   Is the patient a risk to others? No   Has the patient been a risk to others in the past 6 months? No   Has the patient been a risk to others within the distant past? No   Past Medical History: No past medical history on file. No past surgical history on file.  Family History: No family history on file.  Social History:  Social History   Socioeconomic History   Marital status: Single    Spouse name: Not on file   Number of children: Not on file   Years of education: Not on file   Highest education level: Not on file  Occupational History   Not on file  Tobacco Use   Smoking status: Never   Smokeless tobacco: Never  Substance and Sexual Activity   Alcohol use: Yes   Drug use: Yes    Frequency: 7.0 times per week    Types: Marijuana   Sexual activity: Not on file  Other Topics Concern   Not on file  Social History Narrative   Not on file   Social Determinants  of Health   Financial Resource Strain: Not on file  Food Insecurity: Not on file  Transportation Needs: Not on file  Physical Activity: Not on file  Stress: Not on file  Social Connections: Not on file  Intimate Partner Violence: Not on file    SDOH:  SDOH Screenings   Tobacco Use: Low Risk  (08/09/2022)    Last Labs:  Admission on 08/14/2022  Component Date Value Ref Range Status   SARS Coronavirus 2 by RT PCR 08/14/2022 NEGATIVE  NEGATIVE Final   Comment: (NOTE) SARS-CoV-2 target nucleic acids are NOT DETECTED.  The SARS-CoV-2 RNA is generally detectable in upper respiratory specimens during the acute phase of infection. The lowest concentration of SARS-CoV-2 viral copies this assay can detect is 138 copies/mL. A negative result does not preclude SARS-Cov-2 infection and should not be used as the sole basis for treatment or other patient management  decisions. A negative result may occur with  improper specimen collection/handling, submission of specimen other than nasopharyngeal swab, presence of viral mutation(s) within the areas targeted by this assay, and inadequate number of viral copies(<138 copies/mL). A negative result must be combined with clinical observations, patient history, and epidemiological information. The expected result is Negative.  Fact Sheet for Patients:  BloggerCourse.com  Fact Sheet for Healthcare Providers:  SeriousBroker.it  This test is no                          t yet approved or cleared by the [place] FDA and  has been authorized for detection and/or diagnosis of SARS-CoV-2 by FDA under an Emergency Use Authorization (EUA). This EUA will remain  in effect (meaning this test can be used) for the duration of the COVID-19 declaration under Section 564(b)(1) of the Act, 21 U.S.C.section 360bbb-3(b)(1), unless the authorization is terminated  or revoked sooner.       Influenza A by PCR 08/14/2022 NEGATIVE  NEGATIVE Final   Influenza B by PCR 08/14/2022 NEGATIVE  NEGATIVE Final   Comment: (NOTE) The Xpert Xpress SARS-CoV-2/FLU/RSV plus assay is intended as an aid in the diagnosis of influenza from Nasopharyngeal swab specimens and should not be used as a sole basis for treatment. Nasal washings and aspirates are unacceptable for Xpert Xpress SARS-CoV-2/FLU/RSV testing.  Fact Sheet for Patients: BloggerCourse.com  Fact Sheet for Healthcare Providers: SeriousBroker.it  This test is not yet approved or cleared by the [place] FDA and has been authorized for detection and/or diagnosis of SARS-CoV-2 by FDA under an Emergency Use Authorization (EUA). This EUA will remain in effect (meaning this test can be used) for the duration of the COVID-19 declaration under Section 564(b)(1) of the Act,  21 U.S.C. section 360bbb-3(b)(1), unless the authorization is terminated or revoked.  Performed at [facility], [address], [place], [place] [address]    WBC 08/14/2022 5.0  4.0 - 10.5 K/uL Final   RBC 08/14/2022 4.66  4.22 - 5.81 MIL/uL Final   Hemoglobin 08/14/2022 14.6  13.0 - 17.0 g/dL Final   HCT 54/27/0623 43.3  39.0 - 52.0 % Final   MCV 08/14/2022 92.9  80.0 - 100.0 fL Final   MCH 08/14/2022 31.3  26.0 - 34.0 pg Final   MCHC 08/14/2022 33.7  30.0 - 36.0 g/dL Final   RDW 76/28/3151 12.3  11.5 - 15.5 % Final   Platelets 08/14/2022 341  150 - 400 K/uL Final   nRBC 08/14/2022 0.0  0.0 - 0.2 %  Final   Neutrophils Relative % 08/14/2022 45  % Final   Neutro Abs 08/14/2022 2.3  1.7 - 7.7 K/uL Final   Lymphocytes Relative 08/14/2022 42  % Final   Lymphs Abs 08/14/2022 2.1  0.7 - 4.0 K/uL Final   Monocytes Relative 08/14/2022 7  % Final   Monocytes Absolute 08/14/2022 0.3  0.1 - 1.0 K/uL Final   Eosinophils Relative 08/14/2022 5  % Final   Eosinophils Absolute 08/14/2022 0.3  0.0 - 0.5 K/uL Final   Basophils Relative 08/14/2022 1  % Final   Basophils Absolute 08/14/2022 0.1  0.0 - 0.1 K/uL Final   Immature Granulocytes 08/14/2022 0  % Final   Abs Immature Granulocytes 08/14/2022 0.01  0.00 - 0.07 K/uL Final   Performed at [facility], [address] [place], [place] [address]   Sodium 08/14/2022 142  135 - 145 mmol/L Final   Potassium 08/14/2022 3.4 (L)  3.5 - 5.1 mmol/L Final   Chloride 08/14/2022 106  98 - 111 mmol/L Final   CO2 08/14/2022 29  22 - 32 mmol/L Final   Glucose, Bld 08/14/2022 80  70 - 99 mg/dL Final   Glucose reference range applies only to samples taken after fasting for at least 8 hours.   BUN 08/14/2022 13  6 - 20 mg/dL Final   Creatinine, Ser 08/14/2022 1.14  0.61 - 1.24 mg/dL Final   Calcium 60/45/4098 9.5  8.9 - 10.3 mg/dL Final   Total Protein 11/91/4782 7.9  6.5 - 8.1 g/dL Final   Albumin 95/62/1308 4.1  3.5 - 5.0 g/dL Final   AST  65/78/4696 24  15 - 41 U/L Final   ALT 08/14/2022 21  0 - 44 U/L Final   Alkaline Phosphatase 08/14/2022 69  38 - 126 U/L Final   Total Bilirubin 08/14/2022 0.4  0.3 - 1.2 mg/dL Final   GFR, Estimated 08/14/2022 >60  >60 mL/min Final   Comment: (NOTE) Calculated using the CKD-EPI Creatinine Equation (2021)    Anion gap 08/14/2022 7  5 - 15 Final   Performed at [facility], [address] [place], [place] [address]   Hgb A1c MFr Bld 08/14/2022 5.3  4.8 - 5.6 % Final   Comment: (NOTE) Pre diabetes:          5.7%-6.4%  Diabetes:              >6.4%  Glycemic control for   <7.0% adults with diabetes    Mean Plasma Glucose 08/14/2022 105.41  mg/dL Final   Performed at [facility], [address] [place], [place] [address]   Alcohol, Ethyl (B) 08/14/2022 <10  <10 mg/dL Final   Comment: (NOTE) Lowest detectable limit for serum alcohol is 10 mg/dL.  For medical purposes only. Performed at [facility], [address] [place], [place] [address]    Cholesterol 08/14/2022 153  0 - 200 mg/dL Final   Triglycerides 72/53/6644 171 (H)  <150 mg/dL Final   HDL 03/47/4259 52  >40 mg/dL Final   Total CHOL/HDL Ratio 08/14/2022 2.9  RATIO Final   VLDL 08/14/2022 34  0 - 40 mg/dL Final   LDL Cholesterol 08/14/2022 67  0 - 99 mg/dL Final   Comment:        Total Cholesterol/HDL:CHD Risk Coronary Heart Disease Risk Table                     Men   Women  1/2 Average Risk   3.4   3.3  Average Risk       5.0   4.4  2 X Average Risk   9.6   7.1  3 X Average Risk  23.4   11.0        Use the calculated Patient Ratio above and the CHD Risk Table to determine the patient's CHD Risk.        ATP III CLASSIFICATION (LDL):  <100     mg/dL   Optimal  528-413  mg/dL   Near or Above                    Optimal  130-159  mg/dL   Borderline  244-010  mg/dL   High  >272     mg/dL   Very High Performed at [facility], [address] [place], [place] [address]    TSH  08/14/2022 2.296  0.350 - 4.500 uIU/mL Final   Comment: Performed by a 3rd Generation assay with a functional sensitivity of <=0.01 uIU/mL. Performed at [facility], [address], [place], [place] [address]    RPR Ser Ql 08/14/2022 NON REACTIVE  NON REACTIVE Final   Performed at [facility], [address], [place], [place] [address]   POC Amphetamine UR 08/14/2022 None Detected  NONE DETECTED (Cut Off Level 1000 ng/mL) Final   POC Secobarbital (BAR) 08/14/2022 None Detected  NONE DETECTED (Cut Off Level 300 ng/mL) Final   POC Buprenorphine (BUP) 08/14/2022 None Detected  NONE DETECTED (Cut Off Level 10 ng/mL) Final   POC Oxazepam (BZO) 08/14/2022 Positive (A)  NONE DETECTED (Cut Off Level 300 ng/mL) Final   POC Cocaine UR 08/14/2022 Positive (A)  NONE DETECTED (Cut Off Level 300 ng/mL) Final   POC Methamphetamine UR 08/14/2022 None Detected  NONE DETECTED (Cut Off Level 1000 ng/mL) Final   POC Morphine 08/14/2022 None Detected  NONE DETECTED (Cut Off Level 300 ng/mL) Final   POC Methadone UR 08/14/2022 None Detected  NONE DETECTED (Cut Off Level 300 ng/mL) Final   POC Oxycodone UR 08/14/2022 None Detected  NONE DETECTED (Cut Off Level 100 ng/mL) Final   POC Marijuana UR 08/14/2022 Positive (A)  NONE DETECTED (Cut Off Level 50 ng/mL) Final   HIV Screen 4th Generation wRfx 08/14/2022 Non Reactive  Non Reactive Final   Performed at [facility], [address], [place], [place] [address]   SARSCOV2ONAVIRUS 2 AG 08/14/2022 NEGATIVE  NEGATIVE Final   Comment: (NOTE) SARS-CoV-2 antigen NOT DETECTED.   Negative results are presumptive.  Negative results do not preclude SARS-CoV-2 infection and should not be used as the sole basis for treatment or other patient management decisions, including infection  control decisions, particularly in the presence of clinical signs and  symptoms consistent with COVID-19, or in those who have been in contact with the virus.  Negative  results must be combined with clinical observations, patient history, and epidemiological information. The expected result is Negative.  Fact Sheet for Patients: https://www.jennings-kim.com/  Fact Sheet for Healthcare Providers: https://alexander-rogers.biz/  This test is not yet approved or cleared by the [place] FDA and  has been authorized for detection and/or diagnosis of SARS-CoV-2 by FDA under an Emergency Use Authorization (EUA).  This EUA will remain in effect (meaning this test can be used) for the duration of  the COV  ID-19 declaration under Section 564(b)(1) of the Act, 21 U.S.C. section 360bbb-3(b)(1), unless the authorization is terminated or revoked sooner.    Admission on 08/09/2022, Discharged on 08/09/2022  Component Date Value Ref Range Status   Sodium 08/09/2022 141  135 - 145 mmol/L Final   Potassium 08/09/2022 4.9  3.5 - 5.1 mmol/L Final   HEMOLYSIS AT THIS LEVEL MAY AFFECT RESULT   Chloride 08/09/2022 94 (L)  98 - 111 mmol/L Final   CO2 08/09/2022 25  22 - 32 mmol/L Final   Glucose, Bld 08/09/2022 208 (H)  70 - 99 mg/dL Final   Glucose reference range applies only to samples taken after fasting for at least 8 hours.   BUN 08/09/2022 10  6 - 20 mg/dL Final   Creatinine, Ser 08/09/2022 1.17  0.61 - 1.24 mg/dL Final   Calcium 08/09/2022 9.5  8.9 - 10.3 mg/dL Final   Total Protein 08/09/2022 8.3 (H)  6.5 - 8.1 g/dL Final   Albumin 08/09/2022 4.5  3.5 - 5.0 g/dL Final   AST 08/09/2022 47 (H)  15 - 41 U/L Final   HEMOLYSIS AT THIS LEVEL MAY AFFECT RESULT   ALT 08/09/2022 29  0 - 44 U/L Final   HEMOLYSIS AT THIS LEVEL MAY AFFECT RESULT   Alkaline Phosphatase 08/09/2022 40  38 - 126 U/L Corrected   CORRECTED ON 09/24 AT 1630: PREVIOUSLY REPORTED AS 70   Total Bilirubin 08/09/2022 0.5  0.3 - 1.2 mg/dL Final   HEMOLYSIS AT THIS LEVEL MAY AFFECT RESULT   GFR, Estimated 08/09/2022 >60  >60 mL/min Final    Comment: (NOTE) Calculated using the CKD-EPI Creatinine Equation (2021)    Anion gap 08/09/2022 22 (H)  5 - 15 Final   Comment: Electrolytes repeated to confirm. Performed at [facility], [place] [address], [place], [place] [address]    Salicylate Lvl 78/29/5621 <7.0 (L)  7.0 - 30.0 mg/dL Final   Performed at [place] [address], [place], [place] [address]   Acetaminophen (Tylenol), Serum 08/09/2022 <10 (L)  10 - 30 ug/mL Final   Comment: (NOTE) Therapeutic concentrations vary significantly. A range of 10-30 ug/mL  may be an effective concentration for many patients. However, some  are best treated at concentrations outside of this range. Acetaminophen concentrations >150 ug/mL at 4 hours after ingestion  and >50 ug/mL at 12 hours after ingestion are often associated with  toxic reactions.  Performed at [facility], [place] [address] [place], [place] [address]    Alcohol, Ethyl (B) 08/09/2022 56 (H)  <10 mg/dL Final   Comment: (NOTE) Lowest detectable limit for serum alcohol is 10 mg/dL.  For medical purposes only. Performed at [facility], [place] [address] [place], [place] [address]    WBC 08/09/2022 9.4  4.0 - 10.5 K/uL Final   RBC 08/09/2022 4.45  4.22 - 5.81 MIL/uL Final   Hemoglobin 08/09/2022 13.8  13.0 - 17.0 g/dL Final   HCT 08/09/2022 42.2  39.0 - 52.0 % Final   MCV 08/09/2022 94.8  80.0 - 100.0 fL Final   MCH 08/09/2022 31.0  26.0 - 34.0 pg Final   MCHC 08/09/2022 32.7  30.0 - 36.0 g/dL Final   RDW 08/09/2022 12.6  11.5 - 15.5 % Final   Platelets 08/09/2022 317  150 - 400 K/uL Final   nRBC 08/09/2022 0.0  0.0 - 0.2 % Final   Neutrophils Relative % 08/09/2022 77  % Final  Neutro Abs 08/09/2022 7.2  1.7 - 7.7 K/uL Final   Lymphocytes Relative 08/09/2022 15  % Final   Lymphs Abs 08/09/2022 1.4  0.7 - 4.0 K/uL Final   Monocytes Relative 08/09/2022 7  % Final   Monocytes Absolute 08/09/2022 0.7  0.1 - 1.0 K/uL Final   Eosinophils  Relative 08/09/2022 1  % Final   Eosinophils Absolute 08/09/2022 0.1  0.0 - 0.5 K/uL Final   Basophils Relative 08/09/2022 0  % Final   Basophils Absolute 08/09/2022 0.0  0.0 - 0.1 K/uL Final   Immature Granulocytes 08/09/2022 0  % Final   Abs Immature Granulocytes 08/09/2022 0.02  0.00 - 0.07 K/uL Final   Performed at [facility], [address] [place], [place] [id number]    Allergies: Patient has no known allergies.  PTA Medications: (Not in a hospital admission)   Long Term Goals: Improvement in symptoms so as ready for discharge  Short Term Goals: Patient will verbalize feelings in meetings with treatment team members., Patient will attend at least of 50% of the groups daily., Pt will complete the PHQ9 on admission, day 3 and discharge., Patient will participate in completing the [place] Suicide Severity Rating Scale, Patient will score a low risk of violence for 24 hours prior to discharge, and Patient will take medications as prescribed daily.  Medical Decision Making  Patient case review and discussed with Dr. Lucianne MussKumar.  Patient meets criteria for admission to facility based crisis unit for addiction management and detox. Patient displays no evidence of psychosis or active suicidal or homicidal ideations.   Recommendations  Based on my evaluation the patient does not appear to have an emergency medical condition. Patient transferred from continuous assessment unit to the Facility Based Crisis unit. Protocol PRN medications ordered. Patient is currently stable and exhibiting no active withdrawal symptoms.  Joaquin CourtsKimberly David Rodriquez, FNP 08/15/22  11:26 AM

## 2022-08-15 NOTE — ED Notes (Signed)
Pt sitting in dining room watching TV. A&O x4, calm and cooperative. Denies current SI/HI/AVH. No signs of distress noted. Monitoring for safety.  

## 2022-08-15 NOTE — ED Notes (Signed)
Pt sleeping at present, no distress noted.  Monitoring for safety. 

## 2022-08-15 NOTE — ED Notes (Signed)
Patient is denying SI/HI and AVH. Patient reports voices are telling him to relax. Patient ate breakfast and took his medications. Patient has been calm and cooperative. Patient reported he threw up his oatmeal. Patient was given some saltine crackers and cranberry juice he was able to tolerate without difficulty. Patient is receiving Ensure no problems indicated.

## 2022-08-15 NOTE — ED Notes (Signed)
Pt is attending AA. 

## 2022-08-15 NOTE — ED Notes (Signed)
Pt arrived [facility] 

## 2022-08-16 DIAGNOSIS — F1319 Sedative, hypnotic or anxiolytic abuse with unspecified sedative, hypnotic or anxiolytic-induced disorder: Secondary | ICD-10-CM | POA: Diagnosis not present

## 2022-08-16 DIAGNOSIS — Z9151 Personal history of suicidal behavior: Secondary | ICD-10-CM | POA: Diagnosis not present

## 2022-08-16 DIAGNOSIS — Z79899 Other long term (current) drug therapy: Secondary | ICD-10-CM | POA: Diagnosis not present

## 2022-08-16 DIAGNOSIS — F32A Depression, unspecified: Secondary | ICD-10-CM | POA: Diagnosis not present

## 2022-08-16 NOTE — ED Notes (Signed)
Pt asleep in bed. Respirations even and unlabored. Monitoring for safety. 

## 2022-08-16 NOTE — ED Provider Notes (Cosign Needed Addendum)
FBC/OBS ASAP Discharge Summary  Date and Time: 08/16/2022 5:14 PM  Name: Leon Russell  MRN:  [id number]   Discharge Diagnoses:  Final diagnoses:  Substance induced mood disorder ([place])  Benzodiazepine abuse ([place])    Subjective:  Leon Russell 21 y.o., male patient who initially presented to [facility] on 08/14/2022 he was observed overnight in the continuous assessment unit and then recommended for admission to the facility by crisis unit.  Leon Russell, 21 y.o., male patient seen face to face by this provider, consulted with Dr. Dwyane Dee; and chart reviewed on 08/16/22.  Per chart review patient was evaluated in [facility] ED for opiate overdose on 08/09/2022.  Patient self reports a long history of depression and  multiple SI attempts.  He has no outpatient psychiatric services in place at this time.  He does not take any medications at this time.  He reports no history of inpatient psychiatric admissions or any substance abuse treatment.  UDS on admission is positive for cocaine, benzodiazepines and marijuana.  EtOH is negative  During evaluation Leon Russell is sitting in the day room watching TV in no acute distress.  He is alert/oriented x4, cooperative, and attentive.  He is fairly groomed and makes good eye contact.  He has normal speech and behavior.  He is pleasant and was observed talking with other patients.  Reports he is feeling better and feels that his depression has improved.  He feels slightly anxious because he is ready to be discharged.  He has a euthymic affect.  Reports his sleep and appetite have been fair while on the unit. He denies SI/HI/AVH.  He adamantly contracts for safety.  He denies any access to firearms/weapons.  Discussed coping skills.  Discussed suicide intervention/prevention with patient.  He verbally agrees that if he were to begin feeling suicidal he would call 911, or present to the nearest emergency department.  Patient does endorse a long history of depression and  multiple suicide attempts.  He endorses a recent suicide attempt and attributes that to having a difficult time with his ex-girlfriend and substance use.  States when he is using cocaine and Xanax sometimes it intensifies his suicidal ideations.  At the time when he overdosed he immediately regretted his actions and requested for Narcan to be used on him.  He identifies his support system as his mother, father, and younger brother.  He also enjoys his part-time job at [employer].  States he is ready to get back to work.  He feels optimistic about maintaining his sobriety.  He attended the AA group last night and reports that gave him much insight into how he was feeling and helped him with coping skills that will help him to refrain from drug use.  He is forward thinking and future oriented.  At this time his judgment and insight appear to be good. He is able to converse coherently with no distractibility or internal preoccupation.   Leon Russell (mother).  Mother expresses her concerns that patient needs help with substance abuse treatment.  She does not identify any immediate safety concerns.  However she would prefer patient to stay for substance abuse treatment.  Patient is coming home to live with her.  She will be the person who picks him up upon discharge.  Discussed information for CD IOP program.  Stay Summary:   Patient remained calm and cooperative while he was in the continuous assessment unit and while on the [facility] unit.  He exhibited  no unsafe behaviors.  He denied SI/HI/AVH while on the unit.  Patient is requesting to be discharged.  Informed patient that he is leaving AMA, he verbalizes understanding.  He does not meet criteria for involuntary commitment.  He is willing to participate in CD IOP program and a referral has been made.    Leon Russell was evaluated for stability and plans for continued recovery upon discharge.He was offered further treatment options upon discharge including but not limited to  psychiatric services for medication management and therapy, residential, Intensive Outpatient, Outpatient treatment, and Rehabilitation services.   Leon Russell will follow up with the services as listed below under Follow up Information.     Upon completion of this admission the KALAN YELEY was both mentally and medically stable for discharge denying suicidal/homicidal ideation, auditory/visual/tactile hallucinations, delusional thoughts and paranoia.     Total Time spent with patient: 30 minutes  Past Psychiatric History: See H&P Past Medical History: No past medical history on file. No past surgical history on file. Family History: No family history on file. Family Psychiatric History: See H&P Social History:  Social History   Substance and Sexual Activity  Alcohol Use Yes     Social History   Substance and Sexual Activity  Drug Use Yes   Frequency: 7.0 times per week   Types: Marijuana    Social History   Socioeconomic History   Marital status: Single    Spouse name: Not on file   Number of children: Not on file   Years of education: Not on file   Highest education level: Not on file  Occupational History   Not on file  Tobacco Use   Smoking status: Never   Smokeless tobacco: Never  Substance and Sexual Activity   Alcohol use: Yes   Drug use: Yes    Frequency: 7.0 times per week    Types: Marijuana   Sexual activity: Not on file  Other Topics Concern   Not on file  Social History Narrative   Not on file   Social Determinants of Health   Financial Resource Strain: Not on file  Food Insecurity: Not on file  Transportation Needs: Not on file  Physical Activity: Not on file  Stress: Not on file  Social Connections: Not on file   SDOH:  SDOH Screenings   Depression (PHQ2-9): High Risk (08/15/2022)  Tobacco Use: Low Risk  (08/09/2022)    Tobacco Cessation:  N/A, patient does not currently use tobacco products  Current Medications:  Current  Facility-Administered Medications  Medication Dose Route Frequency Provider Last Rate Last Admin   acetaminophen (TYLENOL) tablet 650 mg  650 mg Oral Q6H PRN White, Patrice L, NP       alum & mag hydroxide-simeth (MAALOX/MYLANTA) 200-200-20 MG/5ML suspension 30 mL  30 mL Oral Q4H PRN White, Patrice L, NP       cloNIDine (CATAPRES) tablet 0.1 mg  0.1 mg Oral TID PRN Scot Jun, FNP       dicyclomine (BENTYL) tablet 20 mg  20 mg Oral Q6H PRN Scot Jun, FNP       feeding supplement (ENSURE ENLIVE / ENSURE PLUS) liquid 237 mL  237 mL Oral BID BM White, Patrice L, NP   237 mL at 08/16/22 0944   hydrOXYzine (ATARAX) tablet 25 mg  25 mg Oral TID PRN White, Patrice L, NP       loperamide (IMODIUM) capsule 2-4 mg  2-4 mg Oral PRN White,  Patrice L, NP       LORazepam (ATIVAN) tablet 1 mg  1 mg Oral Q6H PRN Scot Jun, FNP       Or   LORazepam (ATIVAN) injection 1 mg  1 mg Intramuscular Q6H PRN Scot Jun, FNP       magnesium hydroxide (MILK OF MAGNESIA) suspension 30 mL  30 mL Oral Daily PRN White, Patrice L, NP       methocarbamol (ROBAXIN) tablet 500 mg  500 mg Oral Q8H PRN Scot Jun, FNP       multivitamin with minerals tablet 1 tablet  1 tablet Oral Daily White, Patrice L, NP   1 tablet at 08/16/22 0943   naproxen (NAPROSYN) tablet 500 mg  500 mg Oral BID PRN Scot Jun, FNP       ondansetron (ZOFRAN-ODT) disintegrating tablet 4 mg  4 mg Oral Q6H PRN White, Patrice L, NP       traZODone (DESYREL) tablet 50 mg  50 mg Oral QHS PRN White, Patrice L, NP   50 mg at 08/15/22 2204   No current outpatient medications on file.    PTA Medications: (Not in a hospital admission)      08/15/2022    1:30 PM  Depression screen PHQ 2/9  Decreased Interest 1  Down, Depressed, Hopeless 2  PHQ - 2 Score 3  Altered sleeping 3  Tired, decreased energy 2  Change in appetite 3  Feeling bad or failure about yourself  3  Trouble concentrating 0  Moving slowly or  fidgety/restless 3  Suicidal thoughts 1  PHQ-9 Score 18  Difficult doing work/chores Somewhat difficult    Flowsheet Row ED from 08/14/2022 in [facility] Error: Q3, 4, or 5 should not be populated when Q2 is No       Musculoskeletal  Strength & Muscle Tone: within normal limits Gait & Station: normal Patient leans: N/A  Psychiatric Specialty Exam  Presentation  General Appearance:  Appropriate for Environment; Casual  Eye Contact: Good  Speech: Clear and Coherent; Normal Rate  Speech Volume: Normal  Handedness: Right   Mood and Affect  Mood: Anxious  Affect: Congruent   Thought Process  Thought Processes: Coherent  Descriptions of Associations:Intact  Orientation:Full (Time, Place and Person)  Thought Content:Logical  Diagnosis of Schizophrenia or Schizoaffective disorder in past: No    Hallucinations:Hallucinations: None Description of Auditory Hallucinations: none  Ideas of Reference:None  Suicidal Thoughts:Suicidal Thoughts: No  Homicidal Thoughts:Homicidal Thoughts: No   Sensorium  Memory: Immediate Good; Recent Good; Remote Good  Judgment: Good  Insight: Good   Executive Functions  Concentration: Good  Attention Span: Good  Recall: Good  Fund of Knowledge: Good  Language: Good   Psychomotor Activity  Psychomotor Activity: Psychomotor Activity: Normal   Assets  Assets: Physical Health; Resilience; Social Support; Housing; Armed forces logistics/support/administrative officer; Desire for Improvement; Financial Resources/Insurance; Vocational/Educational; Transportation   Sleep  Sleep: Sleep: Fair   Nutritional Assessment (For OBS and FBC admissions only) Nutritional Assessment Referrals: Medication/Tx changes    Physical Exam  Physical Exam Vitals and nursing note reviewed.  Constitutional:      General: He is not in acute distress.    Appearance: Normal appearance. He is  well-developed.  HENT:     Head: Normocephalic.  Eyes:     General:        Right eye: No discharge.        Left eye: No  discharge.  Cardiovascular:     Rate and Rhythm: Normal rate.  Pulmonary:     Effort: Pulmonary effort is normal. No respiratory distress.  Musculoskeletal:        General: Normal range of motion.     Cervical back: Normal range of motion.  Skin:    Coloration: Skin is not jaundiced or pale.  Neurological:     Mental Status: He is alert and oriented to person, place, and time.  Psychiatric:        Attention and Perception: Attention and perception normal.        Mood and Affect: Mood is anxious.        Speech: Speech normal.        Behavior: Behavior normal. Behavior is cooperative.        Thought Content: Thought content normal.        Cognition and Memory: Cognition normal.        Judgment: Judgment normal.    Review of Systems  Constitutional: Negative.   HENT: Negative.    Eyes: Negative.   Respiratory: Negative.    Cardiovascular: Negative.   Musculoskeletal: Negative.   Skin: Negative.   Neurological: Negative.   Psychiatric/Behavioral:  Positive for substance abuse. The patient is nervous/anxious.    Blood pressure 125/75, pulse 65, temperature 97.9 F (36.6 C), temperature source Oral, resp. rate 16, SpO2 100 %. There is no height or weight on file to calculate BMI.  Demographic Factors:  Male and Adolescent or young adult  Loss Factors: NA  Historical Factors: Prior suicide attempts and Impulsivity  Risk Reduction Factors:   Sense of responsibility to family, Employed, Living with another person, especially a relative, Positive social support, Positive therapeutic relationship, and Positive coping skills or problem solving skills  Continued Clinical Symptoms:  Severe Anxiety and/or Agitation Depression:   Comorbid alcohol abuse/dependence Impulsivity Alcohol/Substance Abuse/Dependencies  Cognitive Features That Contribute To Risk:   None    Suicide Risk:  Minimal: No identifiable suicidal ideation.  Patients presenting with no risk factors but with morbid ruminations; may be classified as minimal risk based on the severity of the depressive symptoms  Plan Of Care/Follow-up recommendations:  Activity:  as tolerated  Diet:  regular   Disposition: Discharge patient  Provided outpatient psychiatric resources for medication management, therapy, residential treatment, CD IOP, and outpatient substance abuse treatment.  No evidence of imminent risk to self or others at present.    Patient does not meet criteria for psychiatric inpatient admission. Discussed crisis plan, support from social network, calling 911, coming to the Emergency Department, and calling Suicide Hotline.   Revonda Humphrey, NP 08/16/2022, 5:14 PM

## 2022-08-16 NOTE — ED Notes (Signed)
Nurse Annemarie had group this morning with patients. 

## 2022-08-16 NOTE — ED Notes (Signed)
Pt. Awake, alert in dayroom. Engaged with peers. Denies s/s of withdrawal.Denies SI/HI/ AVH.  Sutures dry and intact to left hand. No redness or drainage noted.Will continue to monitor for safety.

## 2022-08-16 NOTE — Discharge Instructions (Addendum)
The suicide prevention education provided includes the following: Suicide risk factors Suicide prevention and interventions National Suicide Hotline telephone number [facility] assessment telephone number [facility] Emergency Assistance [place] and/or Residential Mobile Crisis Unit telephone number

## 2022-08-17 ENCOUNTER — Telehealth (HOSPITAL_COMMUNITY): Payer: Self-pay | Admitting: Licensed Clinical Social Worker

## 2022-08-17 LAB — GC/CHLAMYDIA PROBE AMP (~~LOC~~) NOT AT ARMC
Chlamydia: NEGATIVE
Comment: NEGATIVE
Comment: NORMAL
Neisseria Gonorrhea: NEGATIVE

## 2022-08-17 NOTE — Telephone Encounter (Signed)
The therapist attempts to reach [facility] by phone regarding the SA IOP offered at this office per Ms. Thomes Lolling, NP's request.   The therapist leaves a HIPAA-compliant voicemail with his direct callback number.  Adam Phenix, [place], LCSW, [facility], [place] 08/17/2022

## 2023-01-01 ENCOUNTER — Other Ambulatory Visit: Payer: Self-pay | Admitting: Physician Assistant

## 2023-01-01 DIAGNOSIS — R112 Nausea with vomiting, unspecified: Secondary | ICD-10-CM

## 2023-01-15 ENCOUNTER — Ambulatory Visit
Admission: RE | Admit: 2023-01-15 | Discharge: 2023-01-15 | Disposition: A | Discharge status: Home / Self Care | Payer: BC Managed Care – PPO | Source: Ambulatory Visit | Attending: Physician Assistant | Admitting: Physician Assistant

## 2023-01-15 DIAGNOSIS — R112 Nausea with vomiting, unspecified: Secondary | ICD-10-CM

## 2023-07-10 ENCOUNTER — Ambulatory Visit
Admission: EM | Admit: 2023-07-10 | Discharge: 2023-07-10 | Disposition: A | Discharge status: Home / Self Care | Payer: BC Managed Care – PPO | Source: Home / Self Care

## 2023-07-10 DIAGNOSIS — K068 Other specified disorders of gingiva and edentulous alveolar ridge: Secondary | ICD-10-CM

## 2023-07-10 DIAGNOSIS — Z9289 Personal history of other medical treatment: Secondary | ICD-10-CM

## 2023-07-10 MED ORDER — DEXAMETHASONE SODIUM PHOSPHATE 10 MG/ML IJ SOLN
10.0000 mg | Freq: Once | INTRAMUSCULAR | Status: AC
  Administered 2023-07-10: 10 mg via INTRAMUSCULAR

## 2023-07-10 NOTE — ED Provider Notes (Signed)
[facility]    CSN: [id number] Arrival date & time: 07/10/23  1243      History   Chief Complaint Chief Complaint  Patient presents with   Dental Problem    HPI VICKI BESSEY is a 22 y.o. male.   Patient presents with bleeding that started this morning upon awakening after having a recent dental surgery approximately 6 days ago.  Patient and family member report that "they cut into the bone in order to place a dental implant".  He is currently taking amoxicillin as prescribed by dentist.  States that there is a blood clot located in the area of bleeding at this time.  He reports very minimal pain.  He was prescribed ibuprofen for pain which he has been taking. Denies any trauma to the mouth recently.      History reviewed. No pertinent past medical history.  Patient Active Problem List   Diagnosis Date Noted   Substance induced mood disorder (H[facility]) 08/15/2022    History reviewed. No pertinent surgical history.     Home Medications    Prior to Admission medications   Medication Sig Start Date End Date Taking? Authorizing Provider  amoxicillin (AMOXIL) 500 MG capsule Take by mouth. 07/05/23  Yes [provider]    Family History History reviewed. No pertinent family history.  Social History Social History   Tobacco Use   Smoking status: Never   Smokeless tobacco: Never  Substance Use Topics   Alcohol use: Yes   Drug use: Yes    Frequency: 7.0 times per week    Types: Marijuana     Allergies   Patient has no known allergies.   Review of Systems Review of Systems Per HPI  Physical Exam Triage Vital Signs ED Triage Vitals  Encounter Vitals Group     BP 07/10/23 1304 126/68     Systolic BP Percentile --      Diastolic BP Percentile --      Pulse Rate 07/10/23 1304 61     Resp 07/10/23 1304 16     Temp 07/10/23 1304 (!) 97.4 F (36.3 C)     Temp Source 07/10/23 1304 Axillary     SpO2 07/10/23 1304 98 %     Weight --       Height --      Head Circumference --      Peak Flow --      Pain Score 07/10/23 1303 0     Pain Loc --      Pain Education --      Exclude from Growth Chart --    No data found.  Updated Vital Signs BP 126/68 (BP Location: Right Arm)   Pulse 61   Temp (!) 97.4 F (36.3 C) (Axillary)   Resp 16   SpO2 98%   Visual Acuity Right Eye Distance:   Left Eye Distance:   Bilateral Distance:    Right Eye Near:   Left Eye Near:    Bilateral Near:     Physical Exam Constitutional:      General: He is not in acute distress.    Appearance: Normal appearance. He is not toxic-appearing or diaphoretic.  HENT:     Head: Normocephalic and atraumatic.     Mouth/Throat:      Comments: Patient is missing tooth to circled area on diagram.  There is a large blood clot in place here.  Minimal bleeding noted.  Unable to visualize any sutures. Eyes:  Extraocular Movements: Extraocular movements intact.     Conjunctiva/sclera: Conjunctivae normal.  Pulmonary:     Effort: Pulmonary effort is normal.  Neurological:     General: No focal deficit present.     Mental Status: He is alert and oriented to person, place, and time. Mental status is at baseline.  Psychiatric:        Mood and Affect: Mood normal.        Behavior: Behavior normal.        Thought Content: Thought content normal.        Judgment: Judgment normal.      UC Treatments / Results  Labs (all labs ordered are listed, but only abnormal results are displayed) Labs Reviewed - No data to display  EKG   Radiology No results found.  Procedures Procedures (including critical care time)  Medications Ordered in UC Medications  dexamethasone (DECADRON) injection 10 mg (10 mg Intramuscular Given 07/10/23 1423)    Initial Impression / Assessment and Plan / UC Course  I have reviewed the triage vital signs and the nursing notes.  Pertinent labs & imaging results that were available during my care of the patient were  reviewed by me and considered in my medical decision making (see chart for details).     Attempted to call [facility] who performed procedure but they were not open and did not have an on-call doctor.  Called Dr. Iven Finn with [facility] on-call dentistry to discuss patient's current symptoms.  He advised IM Decadron, ibuprofen, applying pressure, and using a teabag topically at home to help alleviate inflammation and bleeding.  IM Decadron administered in urgent care prior to discharge.  Patient to continue and complete amoxicillin as well.  Bleeding had stopped prior to discharge.  Will leave blood clot in place per Dr. Mia Creek recommendation so as not to increase bleeding.  He advised that he is to follow-up with the dentist who did the surgery as soon as possible.  Patient was also given strict ER precautions if bleeding persists or worsens.  Patient verbalized understanding and was agreeable with plan. Final Clinical Impressions(s) / UC Diagnoses   Final diagnoses:  Gums, bleeding  History of dental surgery   Discharge Instructions   None    ED Prescriptions   None    PDMP not reviewed this encounter.   Gustavus Bryant, [place] 07/10/23 [phone]

## 2023-07-10 NOTE — ED Triage Notes (Signed)
Patient reports having Orthodontic sx on Monday (top-frontal mouth). Patient states he woke up this morning with bleeding and a blood clot to upper row gum region. The clot is still visible.

## 2024-01-20 ENCOUNTER — Encounter (HOSPITAL_COMMUNITY): Payer: Self-pay

## 2024-01-20 ENCOUNTER — Ambulatory Visit (HOSPITAL_COMMUNITY)
Admission: EM | Admit: 2024-01-20 | Discharge: 2024-01-20 | Disposition: A | Discharge status: Home / Self Care | Attending: Family Medicine | Admitting: Family Medicine

## 2024-01-20 DIAGNOSIS — R319 Hematuria, unspecified: Secondary | ICD-10-CM | POA: Insufficient documentation

## 2024-01-20 LAB — POCT URINALYSIS DIP (MANUAL ENTRY)
Bilirubin, UA: NEGATIVE
Glucose, UA: NEGATIVE mg/dL
Ketones, POC UA: NEGATIVE mg/dL
Leukocytes, UA: NEGATIVE
Nitrite, UA: NEGATIVE
Protein Ur, POC: 30 mg/dL — AB
Spec Grav, UA: >=1.03 — AB (ref 1.010–1.025)
Urobilinogen, UA: 0.2 U/dL
pH, UA: 6 (ref 5.0–8.0)

## 2024-01-20 NOTE — Discharge Instructions (Signed)
 The urinalysis did show red blood cells like you had seen  Urine culture is sent, and if it looks like you have a urine infection, our staff will call you.  Please call the urology office today about this problem  If you start having pain, or if you cannot get your bladder to empty, or if the bleeding becomes faster, please go to the emergency room.

## 2024-01-20 NOTE — ED Triage Notes (Signed)
 Patient presenting with blood in the urine onset 2 days ago. Patient denies any pain with urination or rashes. Patient states some packages did hit him in the groin at work.  Prescriptions or OTC medications tried: No

## 2024-01-20 NOTE — ED Provider Notes (Signed)
 [facility]    CSN: [id number] Arrival date & time: 01/20/24  1032      History   Chief Complaint Chief Complaint  Patient presents with   Hematuria    HPI Leon Russell is a 23 y.o. male.    Hematuria  Here for blood with urination from his urethra.   About 2 days ago he dropped something heavy on his groin area. The next time he went to urinate, he had blood flow from his urethra, and has had some every time he urinates. It will dribble a little more blood after urination. No dysuria or penile pain or abd pain. No hesitancy or other trouble with urinary flow. The flow of blood has lessened since this began, but is still happening. He states he does not see blood on his underwear between urinations.  NKDA  History reviewed. No pertinent past medical history.  Patient Active Problem List   Diagnosis Date Noted   Substance induced mood disorder (HCC) 08/15/2022    Past Surgical History:  Procedure Laterality Date   WISDOM TOOTH EXTRACTION         Home Medications    Prior to Admission medications   Not on File    Family History History reviewed. No pertinent family history.  Social History Social History   Tobacco Use   Smoking status: Never   Smokeless tobacco: Never  Vaping Use   Vaping status: Never Used  Substance Use Topics   Alcohol use: Yes   Drug use: Yes    Frequency: 7.0 times per week    Types: Marijuana     Allergies   Patient has no known allergies.   Review of Systems Review of Systems  Genitourinary:  Positive for hematuria.     Physical Exam Triage Vital Signs ED Triage Vitals  Encounter Vitals Group     BP 01/20/24 1232 123/72     Systolic BP Percentile --      Diastolic BP Percentile --      Pulse Rate 01/20/24 1232 (!) 56     Resp 01/20/24 1232 16     Temp 01/20/24 1232 98.2 F (36.8 C)     Temp Source 01/20/24 1232 Oral     SpO2 01/20/24 1232 98 %     Weight --      Height 01/20/24 1231 5\' 11"   (1.803 m)     Head Circumference --      Peak Flow --      Pain Score 01/20/24 1231 0     Pain Loc --      Pain Education --      Exclude from Growth Chart --    No data found.  Updated Vital Signs BP 123/72 (BP Location: Right Arm)   Pulse (!) 56   Temp 98.2 F (36.8 C) (Oral)   Resp 16   Ht 5\' 11"  (1.803 m)   SpO2 98%   Visual Acuity Right Eye Distance:   Left Eye Distance:   Bilateral Distance:    Right Eye Near:   Left Eye Near:    Bilateral Near:     Physical Exam Vitals reviewed.  Constitutional:      General: He is not in acute distress.    Appearance: He is not ill-appearing, toxic-appearing or diaphoretic.  HENT:     Mouth/Throat:     Mouth: Mucous membranes are moist.  Cardiovascular:     Rate and Rhythm: Normal rate and regular rhythm.  Pulmonary:     Effort: Pulmonary effort is normal.     Breath sounds: Normal breath sounds.  Abdominal:     Palpations: Abdomen is soft.     Tenderness: There is no abdominal tenderness.  Genitourinary:    Comments: Chaperone is present at the time of exam.  There is no swelling of the genital area or penis.  During examination there is a little bit of blood at the urethral meatus that is liquid. Skin:    Coloration: Skin is not jaundiced or pale.  Neurological:     General: No focal deficit present.     Mental Status: He is alert and oriented to person, place, and time.      UC Treatments / Results  Labs (all labs ordered are listed, but only abnormal results are displayed) Labs Reviewed  POCT URINALYSIS DIP (MANUAL ENTRY) - Abnormal; Notable for the following components:      Result Value   Color, UA straw (*)    Clarity, UA cloudy (*)    Spec Grav, UA >=1.030 (*)    Blood, UA large (*)    Protein Ur, POC =30 (*)    All other components within normal limits  URINE CULTURE    EKG   Radiology No results found.  Procedures Procedures (including critical care time)  Medications Ordered in  UC Medications - No data to display  Initial Impression / Assessment and Plan / UC Course  I have reviewed the triage vital signs and the nursing notes.  Pertinent labs & imaging results that were available during my care of the patient were reviewed by me and considered in my medical decision making (see chart for details).     Urinalysis shows some red blood cells consistent with his history.  Vital signs are reassuring.  Since he is experiencing no pain or trouble with urinary retention blocked urinary flow, I am not sending him immediately to the emergency room.  He is given contact information for urology and I have asked him to contact them today.  If he worsens in any way he is to go to the emergency room. Final Clinical Impressions(s) / UC Diagnoses   Final diagnoses:  Hematuria, unspecified type     Discharge Instructions      The urinalysis did show red blood cells like you had seen  Urine culture is sent, and if it looks like you have a urine infection, our staff will call you.  Please call the urology office today about this problem  If you start having pain, or if you cannot get your bladder to empty, or if the bleeding becomes faster, please go to the emergency room.     ED Prescriptions   None    PDMP not reviewed this encounter.   Zenia Resides, MD 01/20/24 [id number]

## 2024-01-21 LAB — URINE CULTURE: Culture: NO GROWTH
